# Patient Record
Sex: Male | Born: 1960 | Race: White | Hispanic: No | Marital: Married | Smoking: Never smoker
Health system: Southern US, Community
[De-identification: ages and names within clinical notes are randomized; demographics above are authoritative.]

## PROBLEM LIST (undated history)

## (undated) DIAGNOSIS — I10 Essential (primary) hypertension: Secondary | ICD-10-CM

## (undated) DIAGNOSIS — F419 Anxiety disorder, unspecified: Secondary | ICD-10-CM

## (undated) DIAGNOSIS — E785 Hyperlipidemia, unspecified: Secondary | ICD-10-CM

## (undated) DIAGNOSIS — G4730 Sleep apnea, unspecified: Secondary | ICD-10-CM

## (undated) DIAGNOSIS — K219 Gastro-esophageal reflux disease without esophagitis: Secondary | ICD-10-CM

## (undated) HISTORY — PX: OTHER SURGICAL HISTORY: SHX169

## (undated) HISTORY — PX: VASECTOMY: SHX75

---

## 2005-04-05 ENCOUNTER — Emergency Department: Payer: Self-pay | Admitting: Emergency Medicine

## 2005-08-20 ENCOUNTER — Ambulatory Visit: Payer: Self-pay | Admitting: Otolaryngology

## 2005-10-07 ENCOUNTER — Ambulatory Visit: Payer: Self-pay | Admitting: Otolaryngology

## 2016-08-24 DIAGNOSIS — G4733 Obstructive sleep apnea (adult) (pediatric): Secondary | ICD-10-CM | POA: Diagnosis not present

## 2016-09-10 ENCOUNTER — Encounter: Payer: Self-pay | Admitting: *Deleted

## 2016-09-11 ENCOUNTER — Ambulatory Visit: Payer: 59 | Admitting: Anesthesiology

## 2016-09-11 ENCOUNTER — Ambulatory Visit
Admission: RE | Admit: 2016-09-11 | Discharge: 2016-09-11 | Disposition: A | Discharge status: Home / Self Care | Payer: 59 | Source: Ambulatory Visit | Attending: Gastroenterology | Admitting: Gastroenterology

## 2016-09-11 ENCOUNTER — Encounter
Admission: RE | Disposition: A | Discharge status: Home / Self Care | Payer: Self-pay | Source: Ambulatory Visit | Attending: Gastroenterology

## 2016-09-11 DIAGNOSIS — F419 Anxiety disorder, unspecified: Secondary | ICD-10-CM | POA: Insufficient documentation

## 2016-09-11 DIAGNOSIS — K635 Polyp of colon: Secondary | ICD-10-CM | POA: Insufficient documentation

## 2016-09-11 DIAGNOSIS — K6389 Other specified diseases of intestine: Secondary | ICD-10-CM | POA: Diagnosis not present

## 2016-09-11 DIAGNOSIS — G4730 Sleep apnea, unspecified: Secondary | ICD-10-CM | POA: Diagnosis not present

## 2016-09-11 DIAGNOSIS — D125 Benign neoplasm of sigmoid colon: Secondary | ICD-10-CM | POA: Diagnosis not present

## 2016-09-11 DIAGNOSIS — Z1211 Encounter for screening for malignant neoplasm of colon: Secondary | ICD-10-CM | POA: Diagnosis present

## 2016-09-11 DIAGNOSIS — Z79899 Other long term (current) drug therapy: Secondary | ICD-10-CM | POA: Diagnosis not present

## 2016-09-11 DIAGNOSIS — I10 Essential (primary) hypertension: Secondary | ICD-10-CM | POA: Insufficient documentation

## 2016-09-11 DIAGNOSIS — K219 Gastro-esophageal reflux disease without esophagitis: Secondary | ICD-10-CM | POA: Diagnosis not present

## 2016-09-11 DIAGNOSIS — D124 Benign neoplasm of descending colon: Secondary | ICD-10-CM | POA: Diagnosis not present

## 2016-09-11 HISTORY — PX: COLONOSCOPY WITH PROPOFOL: SHX5780

## 2016-09-11 HISTORY — DX: Essential (primary) hypertension: I10

## 2016-09-11 HISTORY — DX: Sleep apnea, unspecified: G47.30

## 2016-09-11 HISTORY — DX: Anxiety disorder, unspecified: F41.9

## 2016-09-11 HISTORY — DX: Gastro-esophageal reflux disease without esophagitis: K21.9

## 2016-09-11 HISTORY — DX: Hyperlipidemia, unspecified: E78.5

## 2016-09-11 SURGERY — COLONOSCOPY WITH PROPOFOL
Anesthesia: General

## 2016-09-11 MED ORDER — PROPOFOL 10 MG/ML IV BOLUS
INTRAVENOUS | Status: DC | PRN
  Administered 2016-09-11: 20 mg via INTRAVENOUS
  Administered 2016-09-11: 40 mg via INTRAVENOUS

## 2016-09-11 MED ORDER — FENTANYL CITRATE (PF) 100 MCG/2ML IJ SOLN
INTRAMUSCULAR | Status: AC
  Filled 2016-09-11: qty 2

## 2016-09-11 MED ORDER — PROPOFOL 500 MG/50ML IV EMUL
INTRAVENOUS | Status: AC
  Filled 2016-09-11: qty 50

## 2016-09-11 MED ORDER — LIDOCAINE HCL (PF) 2 % IJ SOLN
INTRAMUSCULAR | Status: AC
  Filled 2016-09-11: qty 2

## 2016-09-11 MED ORDER — PROPOFOL 500 MG/50ML IV EMUL
INTRAVENOUS | Status: DC | PRN
  Administered 2016-09-11: 120 ug/kg/min via INTRAVENOUS

## 2016-09-11 MED ORDER — SODIUM CHLORIDE 0.9 % IV SOLN: INTRAVENOUS | Status: DC

## 2016-09-11 MED ORDER — FENTANYL CITRATE (PF) 100 MCG/2ML IJ SOLN
INTRAMUSCULAR | Status: DC | PRN
  Administered 2016-09-11: 100 ug via INTRAVENOUS

## 2016-09-11 MED ORDER — LIDOCAINE HCL (CARDIAC) 20 MG/ML IV SOLN
INTRAVENOUS | Status: DC | PRN
  Administered 2016-09-11: 2 mL via INTRAVENOUS

## 2016-09-11 MED ORDER — MIDAZOLAM HCL 2 MG/2ML IJ SOLN
INTRAMUSCULAR | Status: AC
  Filled 2016-09-11: qty 2

## 2016-09-11 MED ORDER — SODIUM CHLORIDE 0.9 % IV SOLN
INTRAVENOUS | Status: DC
  Administered 2016-09-11: 1000 mL via INTRAVENOUS

## 2016-09-11 MED ORDER — MIDAZOLAM HCL 2 MG/2ML IJ SOLN
INTRAMUSCULAR | Status: DC | PRN
  Administered 2016-09-11: 2 mg via INTRAVENOUS

## 2016-09-11 NOTE — Transfer of Care (Signed)
Immediate Anesthesia Transfer of Care Note  Patient: Kenneth Mann  Procedure(s) Performed: Procedure(s): COLONOSCOPY WITH PROPOFOL (N/A)  Patient Location: PACU  Anesthesia Type:General  Level of Consciousness: awake  Airway & Oxygen Therapy: Patient Spontanous Breathing and Patient connected to nasal cannula oxygen  Post-op Assessment: Report given to RN and Post -op Vital signs reviewed and stable  Post vital signs: Reviewed  Last Vitals:  Vitals:   09/11/16 0652  BP: (!) 170/92  Pulse: 63  Resp: 18  Temp: 36.3 C    Last Pain: There were no vitals filed for this visit.       Complications: No apparent anesthesia complications

## 2016-09-11 NOTE — H&P (Signed)
Outpatient short stay form Pre-procedure 09/11/2016 7:29 AM Lollie Sails MD  Primary Physician: Dr. Thereasa Distance  Reason for visit:  Colonoscopy  History of present illness:  Kenneth Mann is a 56 year old male presenting today as above. This is first colonoscopy. He tolerated his prep well. He takes no aspirin or blood thinning agents.    Current Facility-Administered Medications:  .  0.9 %  sodium chloride infusion, , Intravenous, Continuous, Lollie Sails, MD, Last Rate: 20 mL/hr at 09/11/16 0701, 1,000 mL at 09/11/16 0701 .  0.9 %  sodium chloride infusion, , Intravenous, Continuous, Lollie Sails, MD  Prescriptions Prior to Admission  Medication Sig Dispense Refill Last Dose  . amLODipine (NORVASC) 5 MG tablet Take 5 mg by mouth daily.   09/10/2016 at Unknown time  . famotidine (PEPCID) 10 MG tablet Take 10 mg by mouth at bedtime.   Past Week at Unknown time  . FLUoxetine (PROZAC) 10 MG tablet Take 10 mg by mouth daily.   Past Week at Unknown time  . valsartan-hydrochlorothiazide (DIOVAN-HCT) 160-12.5 MG tablet Take 1 tablet by mouth daily.   09/10/2016 at Unknown time     No Known Allergies   Past Medical History:  Diagnosis Date  . Anxiety   . Elevated lipids   . GERD (gastroesophageal reflux disease)   . Hypertension   . Sleep apnea     Review of systems:      Physical Exam    Heart and lungs: Regular rate and rhythm without rub or gallop, lungs are bilaterally clear.    HEENT: Normocephalic atraumatic eyes are anicteric    Other:     Pertinant exam for procedure: Soft nontender nondistended bowel sounds positive normoactive.    Planned proceedures: Colonoscopy and indicated procedures. I have discussed the risks benefits and complications of procedures to include not limited to bleeding, infection, perforation and the risk of sedation and the patient wishes to proceed.    Lollie Sails, MD Gastroenterology 09/11/2016  7:29 AM

## 2016-09-11 NOTE — Anesthesia Postprocedure Evaluation (Signed)
Anesthesia Post Note  Patient: Kenneth Mann  Procedure(s) Performed: Procedure(s) (LRB): COLONOSCOPY WITH PROPOFOL (N/A)  Patient location during evaluation: PACU Anesthesia Type: General Level of consciousness: awake Pain management: pain level controlled Vital Signs Assessment: post-procedure vital signs reviewed and stable Respiratory status: nonlabored ventilation Cardiovascular status: stable Anesthetic complications: no     Last Vitals:  Vitals:   09/11/16 0830 09/11/16 0840  BP: (!) 152/93 (!) 152/87  Pulse: (!) 57 (!) 55  Resp: 16 16  Temp:      Last Pain:  Vitals:   09/11/16 0810  TempSrc: Tympanic                 VAN STAVEREN,India Jolin

## 2016-09-11 NOTE — Anesthesia Preprocedure Evaluation (Signed)
Anesthesia Evaluation  Patient identified by MRN, date of birth, ID band Patient awake    Reviewed: Allergy & Precautions, NPO status , Patient's Chart, lab work & pertinent test results  Airway Mallampati: II       Dental  (+) Teeth Intact   Pulmonary sleep apnea ,    breath sounds clear to auscultation       Cardiovascular Exercise Tolerance: Good hypertension, Pt. on medications  Rhythm:Regular Rate:Normal     Neuro/Psych Anxiety    GI/Hepatic Neg liver ROS, GERD  Medicated,  Endo/Other  negative endocrine ROS  Renal/GU negative Renal ROS     Musculoskeletal negative musculoskeletal ROS (+)   Abdominal Normal abdominal exam  (+)   Peds  Hematology negative hematology ROS (+)   Anesthesia Other Findings   Reproductive/Obstetrics                             Anesthesia Physical Anesthesia Plan  ASA: II  Anesthesia Plan: General   Post-op Pain Management:    Induction: Intravenous  Airway Management Planned: Natural Airway and Nasal Cannula  Additional Equipment:   Intra-op Plan:   Post-operative Plan:   Informed Consent: I have reviewed the patients History and Physical, chart, labs and discussed the procedure including the risks, benefits and alternatives for the proposed anesthesia with the patient or authorized representative who has indicated his/her understanding and acceptance.     Plan Discussed with: Surgeon  Anesthesia Plan Comments:         Anesthesia Quick Evaluation

## 2016-09-11 NOTE — Op Note (Signed)
[facility] Gastroenterology Patient Name: Kenneth Mann Procedure Date: 09/11/2016 7:30 AM MRN: [id number] Account #: [id number] Date of Birth: 06-23-1961 Admit Type: Outpatient Age: 56 Room: [facility] ENDO ROOM 1 Gender: Male Note Status: Finalized Procedure:            Colonoscopy Indications:          Screening for colorectal malignant neoplasm, This is                        the patient's first colonoscopy Providers:            Lollie Sails, MD Referring MD:         No Local Md, MD (Referring MD) Medicines:            Monitored Anesthesia Care Complications:        No immediate complications. Procedure:            Pre-Anesthesia Assessment:                       - ASA Grade Assessment: III - A patient with severe                        systemic disease.                       After obtaining informed consent, the colonoscope was                        passed under direct vision. Throughout the procedure,                        the patient's blood pressure, pulse, and oxygen                        saturations were monitored continuously. The                        Colonoscope was introduced through the anus and                        advanced to the the cecum, identified by appendiceal                        orifice and ileocecal valve. The colonoscopy was                        performed without difficulty. The patient tolerated the                        procedure well. The quality of the bowel preparation                        was good. Findings:      A 2 mm polyp was found in the descending colon. The polyp was sessile.       The polyp was removed with a cold biopsy forceps. Resection and       retrieval were complete.      Four sessile polyps were found in the sigmoid colon. The polyps were 1       to 4 mm in size. These polyps were removed with a cold  biopsy forceps.       One was removed with a cold snare. Resection and retrieval were complete.  One 7 mm submucosal nodule was found at 22 cm proximal to the anus.       Biopsies were taken with a cold forceps for histology. Impression:           - One 2 mm polyp in the descending colon, removed with                        a cold biopsy forceps. Resected and retrieved.                       - Four 1 to 4 mm polyps in the sigmoid colon, removed                        with a cold biopsy forceps. Resected and retrieved.                       - Submucosal nodule at 22 cm proximal to the anus.                        Biopsied. Recommendation:       - Discharge patient to home.                       - Await pathology results.                       - Telephone GI clinic for pathology results in 1 week. Procedure Code(s):    --- Professional ---                       [id number], Colonoscopy, flexible; with biopsy, single or                        multiple Diagnosis Code(s):    --- Professional ---                       Z12.11, Encounter for screening for malignant neoplasm                        of colon                       D12.4, Benign neoplasm of descending colon                       D12.5, Benign neoplasm of sigmoid colon                       K63.89, Other specified diseases of intestine CPT copyright 2016 American [employer] Association. All rights reserved. The codes documented in this report are preliminary and upon coder review may  be revised to meet current compliance requirements. Lollie Sails, MD 09/11/2016 8:07:53 AM This report has been signed electronically. Number of Addenda: 0 Note Initiated On: 09/11/2016 7:30 AM Scope Withdrawal Time: 0 hours 22 minutes 29 seconds  Total Procedure Duration: 0 hours 28 minutes 12 seconds       [facility]

## 2016-09-11 NOTE — Anesthesia Post-op Follow-up Note (Signed)
Anesthesia QCDR form completed.        

## 2016-09-14 ENCOUNTER — Encounter: Payer: Self-pay | Admitting: Gastroenterology

## 2016-09-14 LAB — SURGICAL PATHOLOGY

## 2016-09-29 DIAGNOSIS — G4733 Obstructive sleep apnea (adult) (pediatric): Secondary | ICD-10-CM | POA: Diagnosis not present

## 2016-10-16 DIAGNOSIS — E7800 Pure hypercholesterolemia, unspecified: Secondary | ICD-10-CM | POA: Diagnosis not present

## 2016-10-16 DIAGNOSIS — I10 Essential (primary) hypertension: Secondary | ICD-10-CM | POA: Diagnosis not present

## 2016-10-16 DIAGNOSIS — K219 Gastro-esophageal reflux disease without esophagitis: Secondary | ICD-10-CM | POA: Diagnosis not present

## 2016-10-27 DIAGNOSIS — G4733 Obstructive sleep apnea (adult) (pediatric): Secondary | ICD-10-CM | POA: Diagnosis not present

## 2016-11-06 DIAGNOSIS — G4733 Obstructive sleep apnea (adult) (pediatric): Secondary | ICD-10-CM | POA: Diagnosis not present

## 2016-11-27 DIAGNOSIS — G4733 Obstructive sleep apnea (adult) (pediatric): Secondary | ICD-10-CM | POA: Diagnosis not present

## 2016-12-27 DIAGNOSIS — G4733 Obstructive sleep apnea (adult) (pediatric): Secondary | ICD-10-CM | POA: Diagnosis not present

## 2017-01-27 DIAGNOSIS — G4733 Obstructive sleep apnea (adult) (pediatric): Secondary | ICD-10-CM | POA: Diagnosis not present

## 2017-02-02 DIAGNOSIS — G4733 Obstructive sleep apnea (adult) (pediatric): Secondary | ICD-10-CM | POA: Diagnosis not present

## 2017-02-26 DIAGNOSIS — G4733 Obstructive sleep apnea (adult) (pediatric): Secondary | ICD-10-CM | POA: Diagnosis not present

## 2017-03-29 DIAGNOSIS — G4733 Obstructive sleep apnea (adult) (pediatric): Secondary | ICD-10-CM | POA: Diagnosis not present

## 2017-04-29 DIAGNOSIS — G4733 Obstructive sleep apnea (adult) (pediatric): Secondary | ICD-10-CM | POA: Diagnosis not present

## 2017-05-20 DIAGNOSIS — K219 Gastro-esophageal reflux disease without esophagitis: Secondary | ICD-10-CM | POA: Diagnosis not present

## 2017-05-20 DIAGNOSIS — I10 Essential (primary) hypertension: Secondary | ICD-10-CM | POA: Diagnosis not present

## 2017-05-20 DIAGNOSIS — E7800 Pure hypercholesterolemia, unspecified: Secondary | ICD-10-CM | POA: Diagnosis not present

## 2017-05-29 DIAGNOSIS — G4733 Obstructive sleep apnea (adult) (pediatric): Secondary | ICD-10-CM | POA: Diagnosis not present

## 2017-06-16 ENCOUNTER — Ambulatory Visit (INDEPENDENT_AMBULATORY_CARE_PROVIDER_SITE_OTHER): Payer: 59

## 2017-06-16 ENCOUNTER — Ambulatory Visit
Admission: EM | Admit: 2017-06-16 | Discharge: 2017-06-16 | Disposition: A | Discharge status: Home / Self Care | Payer: 59 | Attending: Family Medicine | Admitting: Family Medicine

## 2017-06-16 ENCOUNTER — Encounter: Payer: Self-pay | Admitting: Emergency Medicine

## 2017-06-16 DIAGNOSIS — M25521 Pain in right elbow: Secondary | ICD-10-CM

## 2017-06-16 DIAGNOSIS — M25532 Pain in left wrist: Secondary | ICD-10-CM | POA: Diagnosis not present

## 2017-06-16 DIAGNOSIS — S63502A Unspecified sprain of left wrist, initial encounter: Secondary | ICD-10-CM

## 2017-06-16 DIAGNOSIS — S6992XA Unspecified injury of left wrist, hand and finger(s), initial encounter: Secondary | ICD-10-CM | POA: Diagnosis not present

## 2017-06-16 MED ORDER — NAPROXEN 500 MG PO TABS: 500.0000 mg | ORAL_TABLET | Freq: Two times a day (BID) | ORAL | 0 refills | Status: AC
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
## 2017-06-16 NOTE — Discharge Instructions (Signed)
-  naproxen: one tablet twice a day as needed for pain. Would take twice a day for next couple of days -rest, ice, elevation. See attached -can use supportive wrap or brace if needed until improved -return to clinic or PCP should symptoms worsen

## 2017-06-29 DIAGNOSIS — G4733 Obstructive sleep apnea (adult) (pediatric): Secondary | ICD-10-CM | POA: Diagnosis not present

## 2017-11-22 DIAGNOSIS — I10 Essential (primary) hypertension: Secondary | ICD-10-CM | POA: Diagnosis not present

## 2017-11-22 DIAGNOSIS — Z125 Encounter for screening for malignant neoplasm of prostate: Secondary | ICD-10-CM | POA: Diagnosis not present

## 2017-11-22 DIAGNOSIS — K219 Gastro-esophageal reflux disease without esophagitis: Secondary | ICD-10-CM | POA: Diagnosis not present

## 2017-11-22 DIAGNOSIS — E7800 Pure hypercholesterolemia, unspecified: Secondary | ICD-10-CM | POA: Diagnosis not present

## 2018-04-08 DIAGNOSIS — H40039 Anatomical narrow angle, unspecified eye: Secondary | ICD-10-CM | POA: Diagnosis not present

## 2018-05-31 DIAGNOSIS — I10 Essential (primary) hypertension: Secondary | ICD-10-CM | POA: Diagnosis not present

## 2018-05-31 DIAGNOSIS — E7800 Pure hypercholesterolemia, unspecified: Secondary | ICD-10-CM | POA: Diagnosis not present

## 2018-05-31 DIAGNOSIS — K219 Gastro-esophageal reflux disease without esophagitis: Secondary | ICD-10-CM | POA: Diagnosis not present
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
## 2018-12-01 DIAGNOSIS — Z125 Encounter for screening for malignant neoplasm of prostate: Secondary | ICD-10-CM | POA: Diagnosis not present

## 2018-12-01 DIAGNOSIS — I10 Essential (primary) hypertension: Secondary | ICD-10-CM | POA: Diagnosis not present

## 2018-12-01 DIAGNOSIS — E7800 Pure hypercholesterolemia, unspecified: Secondary | ICD-10-CM | POA: Diagnosis not present

## 2018-12-01 DIAGNOSIS — K219 Gastro-esophageal reflux disease without esophagitis: Secondary | ICD-10-CM | POA: Diagnosis not present
